# Patient Record
Sex: Female | Born: 1980 | Race: White | Hispanic: No | Marital: Married | Smoking: Former smoker
Health system: Southern US, Community
[De-identification: ages and names within clinical notes are randomized; demographics above are authoritative.]

## PROBLEM LIST (undated history)

## (undated) HISTORY — PX: TUBAL LIGATION: SHX77

---

## 2018-07-23 ENCOUNTER — Ambulatory Visit (INDEPENDENT_AMBULATORY_CARE_PROVIDER_SITE_OTHER): Payer: BC Managed Care – PPO

## 2018-07-23 ENCOUNTER — Encounter: Payer: Self-pay | Admitting: Emergency Medicine

## 2018-07-23 ENCOUNTER — Other Ambulatory Visit: Payer: Self-pay

## 2018-07-23 ENCOUNTER — Ambulatory Visit
Admission: EM | Admit: 2018-07-23 | Discharge: 2018-07-23 | Disposition: A | Discharge status: Home / Self Care | Payer: BC Managed Care – PPO | Attending: Urgent Care | Admitting: Urgent Care

## 2018-07-23 DIAGNOSIS — M25572 Pain in left ankle and joints of left foot: Secondary | ICD-10-CM

## 2018-07-23 DIAGNOSIS — W1842XA Slipping, tripping and stumbling without falling due to stepping into hole or opening, initial encounter: Secondary | ICD-10-CM | POA: Diagnosis not present

## 2018-07-23 DIAGNOSIS — S93402A Sprain of unspecified ligament of left ankle, initial encounter: Secondary | ICD-10-CM | POA: Diagnosis not present

## 2018-07-23 NOTE — Discharge Instructions (Signed)
It was very nice seeing you today in clinic. Thank you for entrusting me with your care.   Use Ibuprofen as needed for pain. REST, ICE, and ELEVATE. Wear ACE wrap for comfort.   Make arrangements to follow up with your regular doctor in 1 week for re-evaluation if not improving. If your symptoms/condition worsens, please seek follow up care either here or in the ER. Please remember, our [facility] providers are "right here with you" when you need [place].   Again, it was my pleasure to take care of you today. Thank you for choosing our clinic. I hope that you start to feel better quickly.   Honor Loh, MSN, APRN, FNP-C, CEN Advanced Practice Provider [facility]

## 2018-07-23 NOTE — ED Triage Notes (Signed)
Pt c/o left ankle pain. She states that she tripped and fell in a hole 2 days ago. Ankle is swollen and bruised.

## 2018-07-23 NOTE — ED Provider Notes (Signed)
[place], [place]   Name: Gabrielle Shepherd DOB: 07-21-1980 MRN: [id number] CSN: [id number] PCP: Gabrielle Shepherd  Arrival date and time:  07/23/18 1655  Chief Complaint:  Ankle Pain (left)   NOTE: Prior to seeing the patient today, I have reviewed the triage nursing documentation and vital signs. Clinical staff has updated patient's PMH/PSHx, current medication list, and drug allergies/intolerances to ensure comprehensive history available to assist in medical decision making.   History:   HPI: Gabrielle Shepherd is a 38 y.o. female who presents today with complaints of LEFT ankle pain x 2 days. Patient advises that she was walking in her yard and stepped in a hold causing an inversion injury. She states, "I guess the grass was a little high and I didn't see the hole". She notes pain, swelling, and bruising since the accident. Patient denies previous injuries to this ankle; no surgical interventions. Despite her symptoms, patient has not taken any over the counter interventions to help improve/relieve her reported symptoms at home.   History reviewed. No pertinent past medical history.  Past Surgical History:  Procedure Laterality Date  . TUBAL LIGATION      Family History  Problem Relation Age of Onset  . Healthy Mother   . Healthy Father     Social History   Tobacco Use  . Smoking status: Current Every Day Smoker  . Smokeless tobacco: Never Used  Substance Use Topics  . Alcohol use: Not Currently  . Drug use: Not Currently    There are no active problems to display for this patient.   Home Medications:    Current Meds  Medication Sig  . buPROPion (WELLBUTRIN SR) 150 MG 12 hr tablet Take by mouth.  Marland Kitchen. FLUoxetine (PROZAC) 40 MG capsule TAKE ONE CAPSULE ONCE DAILY.    Allergies:   Patient has no known allergies.  Review of Systems (ROS): Review of Systems  Constitutional: Negative for chills and fever.  Respiratory: Negative for cough and shortness of breath.    Cardiovascular: Negative for chest pain and palpitations.  Musculoskeletal: Positive for gait problem (2/2 pain).       Acute LEFT ankle pain s/p inversion injury.   Skin: Negative for color change and wound.  Neurological: Negative for dizziness, weakness, numbness and headaches.     Vital Signs: Today's Vitals   07/23/18 1722 07/23/18 1725 07/23/18 1810  BP:  (!) 121/92   Pulse:  71   Resp:  18   Temp:  98.6 F (37 C)   TempSrc:  Oral   SpO2:  100%   Weight: 160 lb (72.6 kg)    Height: 5\' 7"  (1.702 m)    PainSc: 2   2     Physical Exam: Physical Exam  Constitutional: She is oriented to person, place, and time and well-developed, well-nourished, and in no distress.  HENT:  Head: Normocephalic and atraumatic.  Mouth/Throat: Mucous membranes are normal.  Neck: Normal range of motion. Neck supple. No tracheal deviation present.  Cardiovascular: Normal rate, regular rhythm, normal heart sounds and intact distal pulses. Exam reveals no gallop and no friction rub.  No murmur heard. Pulmonary/Chest: Effort normal and breath sounds normal. No respiratory distress. She has no wheezes. She has no rales.  Musculoskeletal:     Left ankle: She exhibits swelling and ecchymosis. She exhibits normal range of motion, no deformity and normal pulse. Tenderness. Lateral malleolus tenderness found.  Neurological: She is alert and oriented to person, place, and time. Gait normal. GCS  score is 15.  Skin: Skin is warm and dry. No rash noted.  Psychiatric: Mood, memory, affect and judgment normal.  Nursing note and vitals reviewed.   Urgent Care Treatments / Results:   LABS: PLEASE NOTE: all labs that were ordered this encounter are listed, however only abnormal results are displayed. Labs Reviewed - No data to display  EKG: -None  RADIOLOGY: Dg Ankle Complete Left  Result Date: 07/23/2018 CLINICAL DATA:  Pain I am going to inversion injury 2 days ago EXAM: LEFT ANKLE COMPLETE - 3+ VIEW  COMPARISON:  None FINDINGS: Osseous mineralization normal. Joint spaces preserved. No acute fracture, dislocation, or bone destruction. IMPRESSION: No acute osseous abnormalities. Electronically Signed   By: Gabrielle Shepherd M.D.   On: 07/23/2018 17:43    PROCEDURES: Procedures  MEDICATIONS RECEIVED THIS VISIT: Medications - No data to display  PERTINENT CLINICAL COURSE NOTES/UPDATES:   Initial Impression / Assessment and Plan / Urgent Care Course:  Pertinent labs & imaging results that were available during my care of the patient were personally reviewed by me and considered in my medical decision making (see lab/imaging section of note for values and interpretations).  Gabrielle Shepherd is a 38 y.o. female who presents to [facility] today with complaints of Ankle Pain (left)   Patient overall well appearing and in no acute distress today in clinic. Exam reveals pain swelling to LEFT ankle following inversion injury x 2 days ago. Findings located mainly over lateral malleolus. (+) PMS; temperature, color, and capillary refill all WNL. Foot/ankle warm and dry. Diagnostic radiographs reveal no acute fracture or dislocations. Symptoms consistent with sprain. Will place in compression wrap for comfort. Discussed conservative treat using RICE modality. Offered Tramadol for severe pain, however patient refused.  Discussed follow up with primary care physician in 1 week for re-evaluation. I have reviewed the follow up and strict return precautions for any new or worsening symptoms. Patient is aware of symptoms that would be deemed urgent/emergent, and would thus require further evaluation either here or in the emergency department. At the time of discharge, she verbalized understanding and consent with the discharge plan as it was reviewed with her. All questions were fielded by provider and/or clinic staff prior to patient discharge.    Final Clinical Impressions / Urgent Care Diagnoses:   Final  diagnoses:  Sprain of left ankle, unspecified ligament, initial encounter    New Prescriptions:  [place] Controlled Substance Registry consulted? Not Applicable  No orders of the defined types were placed in this encounter.   Recommended Follow up Care:  Patient encouraged to follow up with the following provider within the specified time frame, or sooner as dictated by the severity of her symptoms. As always, she was instructed that for any urgent/emergent care needs, she should seek care either here or in the emergency department for more immediate evaluation.  Follow-up Information    Bevely Palmer, Shepherd In 1 week.   Specialty: Physician Assistant Why: General reassessment of symptoms if not improving Contact information: [place] [address] [phone]         NOTE: This note was prepared using Dragon dictation software along with smaller phrase technology. Despite my best ability to proofread, there is the potential that transcriptional errors may still occur from this process, and are completely unintentional.     Karen Kitchens, NP 07/25/18 0127

## 2018-10-09 ENCOUNTER — Emergency Department: Payer: BC Managed Care – PPO

## 2018-10-09 ENCOUNTER — Emergency Department
Admission: EM | Admit: 2018-10-09 | Discharge: 2018-10-10 | Disposition: A | Discharge status: Home / Self Care | Payer: BC Managed Care – PPO | Attending: Emergency Medicine | Admitting: Emergency Medicine

## 2018-10-09 ENCOUNTER — Other Ambulatory Visit: Payer: Self-pay

## 2018-10-09 DIAGNOSIS — F17210 Nicotine dependence, cigarettes, uncomplicated: Secondary | ICD-10-CM | POA: Diagnosis not present

## 2018-10-09 DIAGNOSIS — R079 Chest pain, unspecified: Secondary | ICD-10-CM | POA: Insufficient documentation

## 2018-10-09 DIAGNOSIS — E041 Nontoxic single thyroid nodule: Secondary | ICD-10-CM | POA: Insufficient documentation

## 2018-10-09 DIAGNOSIS — F10920 Alcohol use, unspecified with intoxication, uncomplicated: Secondary | ICD-10-CM | POA: Insufficient documentation

## 2018-10-09 DIAGNOSIS — W19XXXA Unspecified fall, initial encounter: Secondary | ICD-10-CM | POA: Diagnosis not present

## 2018-10-09 MED ORDER — SODIUM CHLORIDE 0.9 % IV BOLUS
1000.0000 mL | Freq: Once | INTRAVENOUS | Status: AC
  Administered 2018-10-10: 1000 mL via INTRAVENOUS

## 2018-10-09 NOTE — ED Provider Notes (Signed)
[facility] Emergency Department Provider Note   ____________________________________________   First MD Initiated Contact with Patient 10/09/18 2334     (approximate)  I have reviewed the triage vital signs and the nursing notes.   HISTORY  Chief Complaint Fall    HPI Gabrielle Shepherd is a 38 y.o. female brought to the ED via EMS from home with a chief complaint of fall.  Patient states she now remembers everything: went for a walk before since that, slipped and fell into a creek.  Did not strike head or suffer LOC.  She remembers walking home and think she either fell or laid down in her yard. Found by her neighbor and husband. Unknown how long she was there but husband thinks not long. Patient endorses 3 beers, no substances.  Denies recent fever, cough, chest pain, shortness of breath, abdominal pain, nausea, vomiting or dysuria.  Denies anticoagulant use.  Patient is not a diabetic.  EMS reports glucose 64; given D10.       Past Medical History None  There are no active problems to display for this patient.   Past Surgical History:  Procedure Laterality Date   TUBAL LIGATION      Prior to Admission medications   Medication Sig Start Date End Date Taking? Authorizing Provider  buPROPion (WELLBUTRIN SR) 150 MG 12 hr tablet Take by mouth.    [provider]  FLUoxetine (PROZAC) 40 MG capsule TAKE ONE CAPSULE ONCE DAILY. 11/19/17   [provider]    Allergies Patient has no known allergies.  Family History  Problem Relation Age of Onset   Healthy Mother    Healthy Father     Social History Social History   Tobacco Use   Smoking status: Current Every Day Smoker   Smokeless tobacco: Never Used  Substance Use Topics   Alcohol use: Not Currently   Drug use: Not Currently    Review of Systems  Constitutional: Positive for fall.  No fever/chills Eyes: No visual changes. ENT: No sore throat. Cardiovascular:  Denies chest pain. Respiratory: Denies shortness of breath. Gastrointestinal: No abdominal pain.  No nausea, no vomiting.  No diarrhea.  No constipation. Genitourinary: Negative for dysuria. Musculoskeletal: Negative for back pain. Skin: Negative for rash. Neurological: Negative for headaches, focal weakness or numbness.   ____________________________________________   PHYSICAL EXAM:  VITAL SIGNS: ED Triage Vitals  Enc Vitals Group     BP 10/09/18 2147 (!) 138/100     Pulse Rate 10/09/18 2147 77     Resp 10/09/18 2147 18     Temp 10/09/18 2147 98.4 F (36.9 C)     Temp Source 10/09/18 2147 Oral     SpO2 10/09/18 2147 97 %     Weight 10/09/18 2139 145 lb (65.8 kg)     Height 10/09/18 2139 5\' 7"  (1.702 m)     Head Circumference --      Peak Flow --      Pain Score 10/09/18 2139 0     Pain Loc --      Pain Edu? --      Excl. in GC? --     Constitutional: Alert and oriented. Well appearing and in no acute distress. Eyes: Conjunctivae are normal. PERRL. EOMI. Head: Atraumatic. Nose: Atraumatic. Mouth/Throat: Mucous membranes are moist.  No dental malocclusion. Neck: No stridor.  No cervical spine tenderness to palpation. Cardiovascular: Normal rate, regular rhythm. Grossly normal heart sounds.  Good peripheral circulation. Respiratory: Normal respiratory  effort.  No retractions. Lungs CTAB. Gastrointestinal: Soft and nontender to light or deep palpation. No distention. No abdominal bruits. No CVA tenderness. Musculoskeletal: No lower extremity tenderness nor edema.  No joint effusions. Neurologic: Alert and oriented x3.  CN II-XII grossly intact.  Normal speech and language. No gross focal neurologic deficits are appreciated.  Skin:  Skin is warm, dry and intact. No rash noted. Psychiatric: Mood and affect are normal. Speech and behavior are normal.  ____________________________________________   LABS (all labs ordered are listed, but only abnormal results are  displayed)  Labs Reviewed  CBC WITH DIFFERENTIAL/PLATELET - Abnormal; Notable for the following components:      Result Value   RBC 3.85 (*)    All other components within normal limits  COMPREHENSIVE METABOLIC PANEL - Abnormal; Notable for the following components:   Chloride 113 (*)    CO2 21 (*)    Glucose, Bld 105 (*)    All other components within normal limits  ETHANOL - Abnormal; Notable for the following components:   Alcohol, Ethyl (B) 93 (*)    All other components within normal limits  URINALYSIS, COMPLETE (UACMP) WITH MICROSCOPIC - Abnormal; Notable for the following components:   Color, Urine YELLOW (*)    APPearance HAZY (*)    All other components within normal limits  LIPASE, BLOOD  URINE DRUG SCREEN, QUALITATIVE ([facility] ONLY)  CK  TSH  T4, FREE  POC URINE PREG, ED  POCT PREGNANCY, URINE  TROPONIN I (HIGH SENSITIVITY)  TROPONIN I (HIGH SENSITIVITY)   ____________________________________________  EKG  ED ECG REPORT I, Satin Boal J, the attending physician, personally viewed and interpreted this ECG.   Date: 10/09/2018  EKG Time: 0014  Rate: 68  Rhythm: normal EKG, normal sinus rhythm  Axis: Normal  Intervals:none  ST&T Change: Nonspecific  ED ECG REPORT I, Adlynn Lowenstein J, the attending physician, personally viewed and interpreted this ECG.   Date: 10/10/2018  EKG Time: 0258  Rate: 73  Rhythm: normal EKG, normal sinus rhythm  Axis: Normal  Intervals:none  ST&T Change: Nonspecific  ____________________________________________  RADIOLOGY  ED MD interpretation: No ICH; no acute cardiopulmonary process; no PE  Official radiology report(s): Ct Head Wo Contrast  Result Date: 10/10/2018 CLINICAL DATA:  Patient reports she went for a walk and fell into a creek. Get out of Creek and was found by family splaying in the yard. EXAM: CT HEAD WITHOUT CONTRAST TECHNIQUE: Contiguous axial images were obtained from the base of the skull through the vertex  without intravenous contrast. COMPARISON:  None. FINDINGS: Brain: No evidence of acute infarction, hemorrhage, hydrocephalus, extra-axial collection or mass lesion/mass effect. Prominent perivascular space versus remote lacunar infarct in the right thalamus. Vascular: No hyperdense vessel or unexpected calcification. Skull: No fracture or focal lesion. Sinuses/Orbits: Paranasal sinuses and mastoid air cells are clear. The visualized orbits are unremarkable. Other: None. IMPRESSION: No acute intracranial abnormality. Electronically Signed   By: Narda RutherfordMelanie  Sanford M.D.   On: 10/10/2018 00:10   Ct Angio Chest Pe W/cm &/or Wo Cm  Result Date: 10/10/2018 CLINICAL DATA:  38 year old female with substernal chest pain. EXAM: CT ANGIOGRAPHY CHEST WITH CONTRAST TECHNIQUE: Multidetector CT imaging of the chest was performed using the standard protocol during bolus administration of intravenous contrast. Multiplanar CT image reconstructions and MIPs were obtained to evaluate the vascular anatomy. CONTRAST:  75mL OMNIPAQUE IOHEXOL 350 MG/ML SOLN COMPARISON:  Chest radiograph dated 10/10/2018 FINDINGS: Cardiovascular: There is no cardiomegaly or pericardial effusion. The thoracic aorta is  unremarkable. The visualized origins of the great vessels of the aortic arch appear patent. There is no CT evidence of pulmonary embolism. Mediastinum/Nodes: No hilar or mediastinal adenopathy. The esophagus is grossly unremarkable. Partially calcified 12 mm left thyroid nodule. Further evaluation with ultrasound on a nonemergent basis recommended. No mediastinal fluid collection. Lungs/Pleura: The lungs are clear. There is no pleural effusion or pneumothorax. The central airways are patent. Upper Abdomen: No acute abnormality. Musculoskeletal: No chest wall abnormality. No acute or significant osseous findings. Review of the MIP images confirms the above findings. IMPRESSION: No acute intrathoracic pathology. No CT evidence of pulmonary  embolism. Electronically Signed   By: Anner Crete M.D.   On: 10/10/2018 03:45   Dg Chest Port 1 View  Result Date: 10/10/2018 CLINICAL DATA:  38 year old female with chest pain status post fall. Evaluate for aspiration. EXAM: PORTABLE CHEST 1 VIEW COMPARISON:  None. FINDINGS: The heart size and mediastinal contours are within normal limits. Both lungs are clear. The visualized skeletal structures are unremarkable. IMPRESSION: No active disease. Electronically Signed   By: Anner Crete M.D.   On: 10/10/2018 00:28    ____________________________________________   PROCEDURES  Procedure(s) performed (including Critical Care):  Procedures   ____________________________________________   INITIAL IMPRESSION / ASSESSMENT AND PLAN / ED COURSE  As part of my medical decision making, I reviewed the following data within the [place] History obtained from family, Nursing notes reviewed and incorporated, Labs reviewed, EKG interpreted, Radiograph reviewed, Notes from prior ED visits and [place] Controlled Substance Database     Velisa Regnier was evaluated in Emergency Department on 10/10/2018 for the symptoms described in the history of present illness. She was evaluated in the context of the global COVID-19 pandemic, which necessitated consideration that the patient might be at risk for infection with the SARS-CoV-2 virus that causes COVID-19. Institutional protocols and algorithms that pertain to the evaluation of patients at risk for COVID-19 are in a state of rapid change based on information released by regulatory bodies including the CDC and federal and state organizations. These policies and algorithms were followed during the patient's care in the ED.    38 year old female who presents to the ED status post fall, walked at home and was found laying in her yard.  Differential diagnosis includes but is not limited to [place], CVA, ACS, infectious, metabolic, toxicological  etiologies, etc.  Patient back to baseline currently.  Will obtain lab work, urine, CT head and reassess.  Clinical Course as of Oct 09 624  Thu Oct 10, 2018  0224 Updated patient and spouse on all test results.  Patient feels fine and is eager for discharge home.  Strict return precautions given.  Both verbalized understanding and agree with plan of care.   [JS]  0177 While getting ready for discharge, patient stood up and had sudden onset of sharp, substernal chest pain.  States she has acid reflux.  Will repeat EKG, obtain troponin and send for CTA chest.   [JS]  0349 Patient feeling better.  Updated patient and spouse on CT result.  Strict return precautions given.  Both verbalized understanding agree with plan of care.   [JS]    Clinical Course User Index [JS] Paulette Blanch, MD     ____________________________________________   FINAL CLINICAL IMPRESSION(S) / ED DIAGNOSES  Final diagnoses:  Fall, initial encounter  Chest pain, unspecified type  Thyroid nodule     ED Discharge Orders    None  Note:  This document was prepared using Dragon voice recognition software and may include unintentional dictation errors.   Irean Hong, MD 10/10/18 [phone]

## 2018-10-09 NOTE — ED Triage Notes (Signed)
Patient reports she went out for a walk and fell into a creek.  Remembers the fall and remembers getting out creek, on way home she either fell or laid down on ground in yard and was found by family and they were concerned.

## 2018-10-09 NOTE — ED Notes (Signed)
Pt to ED lobby via w/c, mask in place with no distress noted by EMS; st called out for a "fall"; hx PTSD, anxiety; st pt fell in a creek; D10 admin for FSBS 64; st has had "3 beers"

## 2018-10-10 ENCOUNTER — Emergency Department: Payer: BC Managed Care – PPO

## 2018-10-10 LAB — URINE DRUG SCREEN, QUALITATIVE (ARMC ONLY)
Amphetamines, Ur Screen: NOT DETECTED
Barbiturates, Ur Screen: NOT DETECTED
Benzodiazepine, Ur Scrn: NOT DETECTED
Cannabinoid 50 Ng, Ur ~~LOC~~: NOT DETECTED
Cocaine Metabolite,Ur ~~LOC~~: NOT DETECTED
MDMA (Ecstasy)Ur Screen: NOT DETECTED
Methadone Scn, Ur: NOT DETECTED
Opiate, Ur Screen: NOT DETECTED
Phencyclidine (PCP) Ur S: NOT DETECTED
Tricyclic, Ur Screen: NOT DETECTED

## 2018-10-10 LAB — CBC WITH DIFFERENTIAL/PLATELET
Abs Immature Granulocytes: 0.02 10*3/uL (ref 0.00–0.07)
Basophils Absolute: 0.1 10*3/uL (ref 0.0–0.1)
Basophils Relative: 1 %
Eosinophils Absolute: 0.1 10*3/uL (ref 0.0–0.5)
Eosinophils Relative: 1 %
HCT: 37.7 % (ref 36.0–46.0)
Hemoglobin: 12.5 g/dL (ref 12.0–15.0)
Immature Granulocytes: 0 %
Lymphocytes Relative: 23 %
Lymphs Abs: 2 10*3/uL (ref 0.7–4.0)
MCH: 32.5 pg (ref 26.0–34.0)
MCHC: 33.2 g/dL (ref 30.0–36.0)
MCV: 97.9 fL (ref 80.0–100.0)
Monocytes Absolute: 0.3 10*3/uL (ref 0.1–1.0)
Monocytes Relative: 3 %
Neutro Abs: 6.4 10*3/uL (ref 1.7–7.7)
Neutrophils Relative %: 72 %
Platelets: 234 10*3/uL (ref 150–400)
RBC: 3.85 MIL/uL — ABNORMAL LOW (ref 3.87–5.11)
RDW: 12.4 % (ref 11.5–15.5)
WBC: 8.8 10*3/uL (ref 4.0–10.5)
nRBC: 0 % (ref 0.0–0.2)

## 2018-10-10 LAB — COMPREHENSIVE METABOLIC PANEL
ALT: 21 U/L (ref 0–44)
AST: 24 U/L (ref 15–41)
Albumin: 4.1 g/dL (ref 3.5–5.0)
Alkaline Phosphatase: 60 U/L (ref 38–126)
Anion gap: 9 (ref 5–15)
BUN: 10 mg/dL (ref 6–20)
CO2: 21 mmol/L — ABNORMAL LOW (ref 22–32)
Calcium: 8.9 mg/dL (ref 8.9–10.3)
Chloride: 113 mmol/L — ABNORMAL HIGH (ref 98–111)
Creatinine, Ser: 0.7 mg/dL (ref 0.44–1.00)
GFR calc Af Amer: >60 mL/min (ref >60)
GFR calc non Af Amer: >60 mL/min (ref >60)
Glucose, Bld: 105 mg/dL — ABNORMAL HIGH (ref 70–99)
Potassium: 4.1 mmol/L (ref 3.5–5.1)
Sodium: 143 mmol/L (ref 135–145)
Total Bilirubin: 0.4 mg/dL (ref 0.3–1.2)
Total Protein: 6.7 g/dL (ref 6.5–8.1)

## 2018-10-10 LAB — URINALYSIS, COMPLETE (UACMP) WITH MICROSCOPIC
Bacteria, UA: NONE SEEN
Bilirubin Urine: NEGATIVE
Glucose, UA: NEGATIVE mg/dL
Hgb urine dipstick: NEGATIVE
Ketones, ur: NEGATIVE mg/dL
Leukocytes,Ua: NEGATIVE
Nitrite: NEGATIVE
Protein, ur: NEGATIVE mg/dL
Specific Gravity, Urine: 1.016 (ref 1.005–1.030)
pH: 5 (ref 5.0–8.0)

## 2018-10-10 LAB — ETHANOL: Alcohol, Ethyl (B): 93 mg/dL — ABNORMAL HIGH (ref <10)

## 2018-10-10 LAB — T4, FREE: Free T4: 0.75 ng/dL (ref 0.61–1.12)

## 2018-10-10 LAB — POCT PREGNANCY, URINE: Preg Test, Ur: NEGATIVE

## 2018-10-10 LAB — TSH: TSH: 1.73 u[IU]/mL (ref 0.350–4.500)

## 2018-10-10 LAB — LIPASE, BLOOD: Lipase: 35 U/L (ref 11–51)

## 2018-10-10 LAB — TROPONIN I (HIGH SENSITIVITY): Troponin I (High Sensitivity): <2 ng/L (ref <18)

## 2018-10-10 LAB — CK: Total CK: 70 U/L (ref 38–234)

## 2018-10-10 MED ORDER — ALUM & MAG HYDROXIDE-SIMETH 200-200-20 MG/5ML PO SUSP
30.0000 mL | Freq: Once | ORAL | Status: AC
  Administered 2018-10-10: 30 mL via ORAL
  Filled 2018-10-10: qty 30

## 2018-10-10 MED ORDER — FAMOTIDINE IN NACL 20-0.9 MG/50ML-% IV SOLN
20.0000 mg | Freq: Once | INTRAVENOUS | Status: DC
  Filled 2018-10-10: qty 50

## 2018-10-10 MED ORDER — IOHEXOL 350 MG/ML SOLN
75.0000 mL | Freq: Once | INTRAVENOUS | Status: AC | PRN
  Administered 2018-10-10: 75 mL via INTRAVENOUS

## 2018-10-10 MED ORDER — LIDOCAINE VISCOUS HCL 2 % MT SOLN
15.0000 mL | Freq: Once | OROMUCOSAL | Status: AC
  Administered 2018-10-10: 04:00:00 15 mL via ORAL
  Filled 2018-10-10: qty 15

## 2018-10-10 NOTE — ED Notes (Signed)
Report off to andrea rn  

## 2018-10-10 NOTE — ED Notes (Signed)
Pt to CT

## 2018-10-10 NOTE — ED Notes (Signed)
Pt unable to void at this time. 

## 2018-10-10 NOTE — Discharge Instructions (Addendum)
Please follow-up with your doctor for further work-up of your thyroid nodule.  Return to the ER for worsening symptoms, persistent vomiting, difficulty breathing or other concerns.

## 2018-10-10 NOTE — ED Notes (Signed)
poct pregnancy Negative 

## 2018-10-10 NOTE — ED Notes (Signed)
Pt resting quietly  Family with pt   Iv fluids infusing.

## 2019-04-05 ENCOUNTER — Ambulatory Visit: Payer: BC Managed Care – PPO | Attending: Internal Medicine

## 2019-04-05 DIAGNOSIS — Z23 Encounter for immunization: Secondary | ICD-10-CM

## 2019-04-05 NOTE — Progress Notes (Signed)
   Covid-19 Vaccination Clinic  Name:  Gabrielle Shepherd    MRN: [id number] DOB: September 27, 1980  04/05/2019  [name] was observed post Covid-19 immunization for 15 minutes without incident. She was provided with Vaccine Information Sheet and instruction to access the V-Safe system.   [name] was instructed to call 911 with any severe reactions post vaccine: Marland Kitchen Difficulty breathing  . Swelling of face and throat  . A fast heartbeat  . A bad rash all over body  . Dizziness and weakness   Immunizations Administered    Name Date Dose VIS Date Route   Pfizer COVID-19 Vaccine 04/05/2019 11:28 AM 0.3 mL 12/27/2018 Intramuscular   Manufacturer: [employer], [employer]   Lot: [id number]   NDC: [address]-0

## 2019-04-30 ENCOUNTER — Ambulatory Visit: Payer: BC Managed Care – PPO | Attending: Internal Medicine

## 2019-04-30 DIAGNOSIS — Z23 Encounter for immunization: Secondary | ICD-10-CM

## 2019-04-30 NOTE — Progress Notes (Signed)
   Covid-19 Vaccination Clinic  Name:  Gabrielle Shepherd    MRN: [id number] DOB: [birth year]  04/30/2019  [name] was observed post Covid-19 immunization for 15 minutes without incident. She was provided with Vaccine Information Sheet and instruction to access the V-Safe system.   [name] was instructed to call 911 with any severe reactions post vaccine: Marland Kitchen Difficulty breathing  . Swelling of face and throat  . A fast heartbeat  . A bad rash all over body  . Dizziness and weakness   Immunizations Administered    Name Date Dose VIS Date Route   Pfizer COVID-19 Vaccine 04/30/2019 10:29 AM 0.3 mL 12/27/2018 Intramuscular   Manufacturer: [employer], [employer]   Lot: [id number]   NDC: [address]-3

## 2019-11-10 ENCOUNTER — Ambulatory Visit
Admission: EM | Admit: 2019-11-10 | Discharge: 2019-11-10 | Disposition: A | Discharge status: Home / Self Care | Payer: BC Managed Care – PPO | Attending: Emergency Medicine | Admitting: Emergency Medicine

## 2019-11-10 ENCOUNTER — Other Ambulatory Visit: Payer: Self-pay

## 2019-11-10 ENCOUNTER — Encounter: Payer: Self-pay | Admitting: Emergency Medicine

## 2019-11-10 ENCOUNTER — Ambulatory Visit (INDEPENDENT_AMBULATORY_CARE_PROVIDER_SITE_OTHER)
Admit: 2019-11-10 | Discharge: 2019-11-10 | Disposition: A | Discharge status: Home / Self Care | Payer: BC Managed Care – PPO | Attending: Emergency Medicine | Admitting: Emergency Medicine

## 2019-11-10 DIAGNOSIS — S0990XA Unspecified injury of head, initial encounter: Secondary | ICD-10-CM

## 2019-11-10 DIAGNOSIS — S060X9A Concussion with loss of consciousness of unspecified duration, initial encounter: Secondary | ICD-10-CM | POA: Diagnosis present

## 2019-11-10 LAB — COMPREHENSIVE METABOLIC PANEL
ALT: 23 U/L (ref 0–44)
AST: 24 U/L (ref 15–41)
Albumin: 4.4 g/dL (ref 3.5–5.0)
Alkaline Phosphatase: 57 U/L (ref 38–126)
Anion gap: 10 (ref 5–15)
BUN: 11 mg/dL (ref 6–20)
CO2: 24 mmol/L (ref 22–32)
Calcium: 8.8 mg/dL — ABNORMAL LOW (ref 8.9–10.3)
Chloride: 104 mmol/L (ref 98–111)
Creatinine, Ser: 0.79 mg/dL (ref 0.44–1.00)
GFR, Estimated: >60 mL/min (ref >60)
Glucose, Bld: 132 mg/dL — ABNORMAL HIGH (ref 70–99)
Potassium: 3.8 mmol/L (ref 3.5–5.1)
Sodium: 138 mmol/L (ref 135–145)
Total Bilirubin: 0.8 mg/dL (ref 0.3–1.2)
Total Protein: 7.1 g/dL (ref 6.5–8.1)

## 2019-11-10 LAB — CBC WITH DIFFERENTIAL/PLATELET
Abs Immature Granulocytes: 0.03 10*3/uL (ref 0.00–0.07)
Basophils Absolute: 0.1 10*3/uL (ref 0.0–0.1)
Basophils Relative: 1 %
Eosinophils Absolute: 0.1 10*3/uL (ref 0.0–0.5)
Eosinophils Relative: 1 %
HCT: 40.6 % (ref 36.0–46.0)
Hemoglobin: 13.7 g/dL (ref 12.0–15.0)
Immature Granulocytes: 0 %
Lymphocytes Relative: 34 %
Lymphs Abs: 2.6 10*3/uL (ref 0.7–4.0)
MCH: 31.2 pg (ref 26.0–34.0)
MCHC: 33.7 g/dL (ref 30.0–36.0)
MCV: 92.5 fL (ref 80.0–100.0)
Monocytes Absolute: 0.4 10*3/uL (ref 0.1–1.0)
Monocytes Relative: 6 %
Neutro Abs: 4.4 10*3/uL (ref 1.7–7.7)
Neutrophils Relative %: 58 %
Platelets: 226 10*3/uL (ref 150–400)
RBC: 4.39 MIL/uL (ref 3.87–5.11)
RDW: 12.4 % (ref 11.5–15.5)
WBC: 7.6 10*3/uL (ref 4.0–10.5)
nRBC: 0 % (ref 0.0–0.2)

## 2019-11-10 MED ORDER — ONDANSETRON 8 MG PO TBDP: 8.0000 mg | ORAL_TABLET | Freq: Three times a day (TID) | ORAL | 0 refills | Status: DC | PRN

## 2019-11-10 NOTE — ED Triage Notes (Signed)
Patient states she fell Saturday when she slipped and fell on some walnuts. She states she hit her head on the ground and had no LOC. She states Sunday early am she got up to go to the bathroom and passed out and hit her head on the floor. She states she was out for a few seconds. She states she got up to continue to go to the bathroom and passed out again, hitting her head on the bathroom counter. She had LOC again but she is not sure for how long. Her husband was home and he came to her and she was still unconscious. EMS was called and they advised for her to go to the ER but she refused. She is now c/o headache, nausea, and fatigue.

## 2019-11-10 NOTE — Discharge Instructions (Addendum)
Your CT scan was negative for a bleed or skull fracture.  Your symptoms are consistent with a concussion.  You need brain rest and to avoid screen time, including phones, tablets, computers, or television.  Your blood work and EKG were also unremarkable.  We still do not have a reason for your 2 episodes of passing out.  I am recommending that you follow-up with your primary care doctor this week for possible referral to cardiology for Holter monitor study to rule out an arrhythmia.  Use the Zofran as needed for nausea.  If you have another episode of passing out you need to go to the ER for evaluation.

## 2019-11-10 NOTE — ED Provider Notes (Signed)
[facility]    CSN: [id number] Arrival date & time: 11/10/19  1532     Patient is here for evaluation of headache, nausea, and fatigue.  Patient reports that she slipped on some walnuts and fell Saturday hitting her head on the ground but did not lose consciousness.  Sunday morning she got up to go to the bathroom and passed out hitting her head on the floor.  She is not sure how long she was out but thinks it was for a few seconds.  She reports she got up to use the bathroom again and passed out hitting her head on the bathroom counter.  She had another loss of consciousness but she is not sure for how long.  She reports she was found unresponsive by her husband.  EMS was called and advised her to go to the ER but she refused.  Patient denies visual changes, numbness, tingling, weakness, changes in appetite.  Patient denies chest pain, or shortness of breath, or palpitations.  Patient has had syncope in the past.  One time was for some stroke and the other time was because of low blood sugar.  History   Chief Complaint Chief Complaint  Patient presents with  . Head Injury  . Loss of Consciousness  . Fall    HPI Gabrielle Shepherd is a 39 y.o. female.   HPI  History reviewed. No pertinent past medical history.  There are no problems to display for this patient.   Past Surgical History:  Procedure Laterality Date  . TUBAL LIGATION      OB History   No obstetric history on file.      Home Medications    Prior to Admission medications   Medication Sig Start Date End Date Taking? Authorizing Provider  buPROPion (WELLBUTRIN SR) 150 MG 12 hr tablet Take by mouth.  11/10/19  [provider]  FLUoxetine (PROZAC) 40 MG capsule TAKE ONE CAPSULE ONCE DAILY. 11/19/17 11/10/19  [provider]    Family History Family History  Problem Relation Age of Onset  . Healthy Mother   . Healthy Father     Social History Social History   Tobacco Use  .  Smoking status: Former Games developer  . Smokeless tobacco: Never Used  Vaping Use  . Vaping Use: Every day  Substance Use Topics  . Alcohol use: Yes  . Drug use: Not Currently     Allergies   Patient has no known allergies.   Review of Systems Review of Systems  Constitutional: Positive for fatigue. Negative for activity change, appetite change and fever.  HENT: Negative for congestion, nosebleeds, sinus pain and sore throat.   Respiratory: Negative for cough, shortness of breath and wheezing.   Cardiovascular: Negative for chest pain and palpitations.  Gastrointestinal: Positive for nausea. Negative for diarrhea and vomiting.  Genitourinary: Negative for dysuria and frequency.  Musculoskeletal: Positive for back pain and neck pain. Negative for arthralgias and myalgias.       Patient has pain in her neck and back.  Patient also has pain in both parietal areas of her skull.  Skin: Negative for color change and rash.  Neurological: Positive for syncope and headaches. Negative for dizziness, tremors, seizures, speech difficulty, weakness and numbness.  Psychiatric/Behavioral: Negative.      Physical Exam Triage Vital Signs ED Triage Vitals  Enc Vitals Group     BP 11/10/19 1637 110/78     Pulse Rate 11/10/19 1637 65     Resp  11/10/19 1637 18     Temp 11/10/19 1637 98.5 F (36.9 C)     Temp Source 11/10/19 1637 Oral     SpO2 11/10/19 1637 100 %     Weight 11/10/19 1634 170 lb (77.1 kg)     Height 11/10/19 1634 5\' 7"  (1.702 m)     Head Circumference --      Peak Flow --      Pain Score 11/10/19 1634 3     Pain Loc --      Pain Edu? --      Excl. in GC? --    No data found.  Updated Vital Signs BP 110/78 (BP Location: Right Arm)   Pulse 65   Temp 98.5 F (36.9 C) (Oral)   Resp 18   Ht 5\' 7"  (1.702 m)   Wt 170 lb (77.1 kg)   SpO2 100%   BMI 26.63 kg/m   Visual Acuity Right Eye Distance:   Left Eye Distance:   Bilateral Distance:    Right Eye Near:   Left Eye  Near:    Bilateral Near:     Physical Exam Vitals and nursing note reviewed.  Constitutional:      General: She is not in acute distress.    Appearance: Normal appearance. She is not ill-appearing.  HENT:     Head: Normocephalic and atraumatic.     Comments: Patient complaining of pain in both left and right parietal area.  No hematoma, abrasion, or swelling noted on exam.  Both areas are tender to touch.    Right Ear: Tympanic membrane, ear canal and external ear normal. There is no impacted cerumen.     Left Ear: Tympanic membrane, ear canal and external ear normal. There is no impacted cerumen.     Nose: Nose normal. No rhinorrhea.     Mouth/Throat:     Mouth: Mucous membranes are moist.     Pharynx: Oropharynx is clear. No oropharyngeal exudate or posterior oropharyngeal erythema.  Eyes:     General: No scleral icterus.       Right eye: No discharge.        Left eye: No discharge.     Extraocular Movements: Extraocular movements intact.     Conjunctiva/sclera: Conjunctivae normal.     Pupils: Pupils are equal, round, and reactive to light.  Neck:     Vascular: No carotid bruit.     Comments: Patient has no bony tenderness in her neck.  Patient does have paraspinal tenderness on both sides of her neck. Cardiovascular:     Rate and Rhythm: Normal rate and regular rhythm.     Pulses: Normal pulses.     Heart sounds: Normal heart sounds. No murmur heard.  No gallop.   Pulmonary:     Effort: Pulmonary effort is normal.     Breath sounds: Normal breath sounds. No wheezing, rhonchi or rales.  Musculoskeletal:        General: Tenderness present. No swelling, deformity or signs of injury. Normal range of motion.     Cervical back: Normal range of motion and neck supple.     Right lower leg: No edema.     Left lower leg: No edema.     Comments: Patient complaining of pain over the both mastoid processes.  No battle sign or raccoon eyes noted.  Lymphadenopathy:     Cervical: No  cervical adenopathy.  Skin:    General: Skin is warm and dry.  Capillary Refill: Capillary refill takes less than 2 seconds.     Findings: No bruising or erythema.  Neurological:     General: No focal deficit present.     Mental Status: She is alert and oriented to person, place, and time.     Cranial Nerves: No cranial nerve deficit.     Sensory: No sensory deficit.     Motor: No weakness.     Coordination: Coordination normal.     Gait: Gait normal.     Deep Tendon Reflexes: Reflexes normal.     Comments: Groups, upper arm, lower leg strength 5 out of 5.  DTRs 2+ globally.  Psychiatric:        Mood and Affect: Mood normal.        Behavior: Behavior normal.        Thought Content: Thought content normal.        Judgment: Judgment normal.      UC Treatments / Results  Labs (all labs ordered are listed, but only abnormal results are displayed) Labs Reviewed  COMPREHENSIVE METABOLIC PANEL - Abnormal; Notable for the following components:      Result Value   Glucose, Bld 132 (*)    Calcium 8.8 (*)    All other components within normal limits  CBC WITH DIFFERENTIAL/PLATELET    EKG   Radiology CT Head Wo Contrast  Result Date: 11/10/2019 CLINICAL DATA:  Larey Seat and hit head EXAM: CT HEAD WITHOUT CONTRAST TECHNIQUE: Contiguous axial images were obtained from the base of the skull through the vertex without intravenous contrast. COMPARISON:  CT brain 10/09/2018 FINDINGS: Brain: No acute territorial infarction, hemorrhage, or intracranial mass. The ventricles are nonenlarged. Dilated perivascular space or chronic infarct in the right basal ganglia as before. Nonenlarged ventricles Vascular: No hyperdense vessels.  No unexpected calcification Skull: Normal. Negative for fracture or focal lesion. Sinuses/Orbits: No acute finding. Other: None IMPRESSION: No CT evidence for acute intracranial abnormality. Electronically Signed   By: Jasmine Pang M.D.   On: 11/10/2019 18:11     Procedures Procedures (including critical care time)  Medications Ordered in UC Medications - No data to display  Initial Impression / Assessment and Plan / UC Course  I have reviewed the triage vital signs and the nursing notes.  Pertinent labs & imaging results that were available during my care of the patient were reviewed by me and considered in my medical decision making (see chart for details).   Patient is here for 2 separate episodes of syncope with loss of consciousness of unknown duration.  These have been 2 days ago.  Patient was evaluated by EMS at the time and advised to go to the ER but she declined.  She is here because she is still having a headache, nausea, and fatigue.  Physical exam reveals no ecchymosis, edema, or hematoma to scalp or head.  No spinal tenderness to palpation.  No carotid bruits, heart sounds S1-S2, lung sounds clear to auscultation bilaterally patient has full range of motion of all extremities without deformity, ecchymosis, or abrasion.  Strength is strong and equal bilaterally deep tendon reflexes are normal globally.  Cranial nerves II through XII intact.  Discussed need for ER evaluation with the patient.  Patient has a PCP.  Discussed the concerns about syncope without attributable cause.  We will do screening labs, EKG, and head CT.   CBC unremarkable  CMP shows elevated glucose of 132 electrolytes LFTs normal.  EKG shows normal sinus rhythm with a  rate of 63.  Normal axis.  No interval change when compared to EKG from 10/15/2018.  CT negative for acute intracranial process.  Will DC home with a diagnosis of concussion and have patient follow-up with PCP this week.   Final Clinical Impressions(s) / UC Diagnoses   Final diagnoses:  Injury of head, initial encounter  Concussion with loss of consciousness, initial encounter     Discharge Instructions     Your CT scan was negative for a bleed or skull fracture.  Your symptoms are consistent  with a concussion.  You need brain rest and to avoid screen time, including phones, tablets, computers, or television.  Your blood work and EKG were also unremarkable.  We still do not have a reason for your 2 episodes of passing out.  I am recommending that you follow-up with your primary care doctor this week for possible referral to cardiology for Holter monitor study to rule out an arrhythmia.  If you have another episode of passing out you need to go to the ER for evaluation.    ED Prescriptions    None     PDMP not reviewed this encounter.   Becky Augusta, NP 11/10/19 1829

## 2019-11-11 NOTE — ED Notes (Signed)
Called [employer] [phone]) to get prior auth of CT Head without contrast. Spoke to [place], auth# [id number] valid 11/10/19.

## 2020-06-30 ENCOUNTER — Encounter: Payer: Self-pay | Admitting: Emergency Medicine

## 2020-06-30 ENCOUNTER — Ambulatory Visit
Admission: EM | Admit: 2020-06-30 | Discharge: 2020-06-30 | Disposition: A | Discharge status: Home / Self Care | Payer: BC Managed Care – PPO | Attending: Physician Assistant | Admitting: Physician Assistant

## 2020-06-30 ENCOUNTER — Other Ambulatory Visit: Payer: Self-pay

## 2020-06-30 DIAGNOSIS — M2550 Pain in unspecified joint: Secondary | ICD-10-CM

## 2020-06-30 DIAGNOSIS — S30861A Insect bite (nonvenomous) of abdominal wall, initial encounter: Secondary | ICD-10-CM

## 2020-06-30 DIAGNOSIS — W57XXXA Bitten or stung by nonvenomous insect and other nonvenomous arthropods, initial encounter: Secondary | ICD-10-CM

## 2020-06-30 DIAGNOSIS — R5383 Other fatigue: Secondary | ICD-10-CM | POA: Diagnosis not present

## 2020-06-30 DIAGNOSIS — R110 Nausea: Secondary | ICD-10-CM | POA: Diagnosis not present

## 2020-06-30 MED ORDER — TRIAMCINOLONE ACETONIDE 0.5 % EX OINT: 1.0000 "application " | TOPICAL_OINTMENT | Freq: Two times a day (BID) | CUTANEOUS | 0 refills | Status: AC

## 2020-06-30 MED ORDER — DOXYCYCLINE HYCLATE 100 MG PO CAPS: 100.0000 mg | ORAL_CAPSULE | Freq: Two times a day (BID) | ORAL | 0 refills | Status: AC

## 2020-06-30 NOTE — Discharge Instructions (Addendum)
I am unsure if your symptoms are related to tick bite or not, but it is possible and you have decided to be treated for that possibility today. I have sent in doxycycline to pharmacy. You should continue to treat headaches and joint pains with OTC Tylenol an Advil, rest and increase fluids. If still having symptoms after completing medication or if symptoms worsen, please return or see PCP.

## 2020-06-30 NOTE — ED Provider Notes (Signed)
[facility]    CSN: [id number] Arrival date & time: 06/30/20  0901      History   Chief Complaint Chief Complaint  Patient presents with   Insect Bite   Fatigue    HPI Gabrielle Shepherd is a 40 y.o. female presenting for concerns about possible tick bite.  Patient states that about a week ago she noticed a red and itchy area on her abdomen.  She believes that there was a possible tick there.  She will see 1.  States that she often is bit by ticks.  Patient concerned about tick disease.  She states that over the past 3 days she started to feel fatigued and have joint pains as well as headache and nausea.  Patient has not had any fevers.  She denies any cough, congestion, sore throat, dizziness, breathing difficulty, vomiting or diarrhea.  She has been taking Advil for her joint pains and headaches.  She denies sick contacts and no known exposure to COVID. Patient is only concerned about possible tick disease she has no other complaints.  HPI  History reviewed. No pertinent past medical history.  There are no problems to display for this patient.   Past Surgical History:  Procedure Laterality Date   TUBAL LIGATION      OB History   No obstetric history on file.      Home Medications    Prior to Admission medications   Medication Sig Start Date End Date Taking? Authorizing Provider  doxycycline (VIBRAMYCIN) 100 MG capsule Take 1 capsule (100 mg total) by mouth 2 (two) times daily for 10 days. 06/30/20 07/10/20 Yes Eusebio Friendly B, PA-C  triamcinolone ointment (KENALOG) 0.5 % Apply 1 application topically 2 (two) times daily for 7 days. 06/30/20 07/07/20 Yes Eusebio Friendly B, PA-C  ondansetron (ZOFRAN ODT) 8 MG disintegrating tablet Take 1 tablet (8 mg total) by mouth every 8 (eight) hours as needed for nausea or vomiting. 11/10/19   Becky Augusta, NP  buPROPion [facility] SR) 150 MG 12 hr tablet Take by mouth.  11/10/19  [provider]  FLUoxetine (PROZAC) 40  MG capsule TAKE ONE CAPSULE ONCE DAILY. 11/19/17 11/10/19  [provider]    Family History Family History  Problem Relation Age of Onset   Healthy Mother    Healthy Father     Social History Social History   Tobacco Use   Smoking status: Former    Pack years: 0.00   Smokeless tobacco: Never  Vaping Use   Vaping Use: Every day  Substance Use Topics   Alcohol use: Yes   Drug use: Not Currently     Allergies   Patient has no known allergies.   Review of Systems Review of Systems  Constitutional:  Positive for fatigue. Negative for fever.  HENT:  Negative for congestion, rhinorrhea and sore throat.   Respiratory:  Negative for cough, shortness of breath and wheezing.   Gastrointestinal:  Positive for nausea. Negative for abdominal pain, diarrhea and vomiting.  Musculoskeletal:  Positive for arthralgias. Negative for joint swelling.  Skin:  Positive for rash.  Neurological:  Positive for headaches. Negative for dizziness and weakness.    Physical Exam Triage Vital Signs ED Triage Vitals [06/30/20 0953]  Enc Vitals Group     BP 121/82     Pulse Rate 62     Resp 18     Temp 98 F (36.7 C)     Temp Source Oral     SpO2  100 %     Weight 169 lb 15.6 oz (77.1 kg)     Height 5\' 7"  (1.702 m)     Head Circumference      Peak Flow      Pain Score 2     Pain Loc      Pain Edu?      Excl. in GC?    No data found.  Updated Vital Signs BP 121/82 (BP Location: Right Arm)   Pulse 62   Temp 98 F (36.7 C) (Oral)   Resp 18   Ht 5\' 7"  (1.702 m)   Wt 169 lb 15.6 oz (77.1 kg)   SpO2 100%   BMI 26.62 kg/m       Physical Exam Vitals and nursing note reviewed.  Constitutional:      General: She is not in acute distress.    Appearance: Normal appearance. She is not ill-appearing or toxic-appearing.  HENT:     Head: Normocephalic and atraumatic.     Nose: Nose normal.     Mouth/Throat:     Mouth: Mucous membranes are moist.     Pharynx: Oropharynx is  clear.  Eyes:     General: No scleral icterus.       Right eye: No discharge.        Left eye: No discharge.     Extraocular Movements: Extraocular movements intact.     Conjunctiva/sclera: Conjunctivae normal.     Pupils: Pupils are equal, round, and reactive to light.  Cardiovascular:     Rate and Rhythm: Normal rate and regular rhythm.     Heart sounds: Normal heart sounds.  Pulmonary:     Effort: Pulmonary effort is normal. No respiratory distress.     Breath sounds: Normal breath sounds.  Abdominal:     Palpations: Abdomen is soft.     Tenderness: There is no abdominal tenderness.  Musculoskeletal:     Cervical back: Neck supple.  Skin:    General: Skin is dry.     Findings: Rash (quarter sized area of erythma left of umbilicus. Non tender) present.  Neurological:     General: No focal deficit present.     Mental Status: She is alert. Mental status is at baseline.     Motor: No weakness.     Gait: Gait normal.  Psychiatric:        Mood and Affect: Mood normal.        Behavior: Behavior normal.        Thought Content: Thought content normal.     UC Treatments / Results  Labs (all labs ordered are listed, but only abnormal results are displayed) Labs Reviewed - No data to display  EKG   Radiology No results found.  Procedures Procedures (including critical care time)  Medications Ordered in UC Medications - No data to display  Initial Impression / Assessment and Plan / UC Course  I have reviewed the triage vital signs and the nursing notes.  Pertinent labs & imaging results that were available during my care of the patient were reviewed by me and considered in my medical decision making (see chart for details).  40 year old female presenting for possible tick bite or rash of the abdominal wall, fatigue, polyarthralgias, nausea and headaches.  Patient concerned about tick disease.  Vitals are all stable.  Exam significant for a erythematous rash to the left  of the umbilicus that is nontender.  Unsure if this is an actual tick bite since  she did not see one, but patient concerned about tick disease.  Given some of her symptoms, feel it is reasonable to treat her for tick disease with doxycycline 10-day course.  Also sent in triamcinolone ointment.  Advised abortive care with the Advil and increase rest and fluids.  Advised close follow-up with PCP, especially if symptoms are worsening or not improving.  Advised to return here for any new or worsening symptoms if she cannot see her PCP.  ED precautions reviewed.  Final Clinical Impressions(s) / UC Diagnoses   Final diagnoses:  Fatigue, unspecified type  Polyarthralgia  Nausea without vomiting  Tick bite of abdominal wall, initial encounter     Discharge Instructions      I am unsure if your symptoms are related to tick bite or not, but it is possible and you have decided to be treated for that possibility today. I have sent in doxycycline to pharmacy. You should continue to treat headaches and joint pains with OTC Tylenol an Advil, rest and increase fluids. If still having symptoms after completing medication or if symptoms worsen, please return or see PCP.      ED Prescriptions     Medication Sig Dispense Auth. Provider   triamcinolone ointment (KENALOG) 0.5 % Apply 1 application topically 2 (two) times daily for 7 days. 30 g Eusebio Friendly B, PA-C   doxycycline (VIBRAMYCIN) 100 MG capsule Take 1 capsule (100 mg total) by mouth 2 (two) times daily for 10 days. 20 capsule Shirlee Latch, PA-C      PDMP not reviewed this encounter.   Shirlee Latch, PA-C 06/30/20 [phone]

## 2020-06-30 NOTE — ED Triage Notes (Signed)
Pt c/o tick bite on the center of her abdomen. Noticed the area itching about 3 days ago. Started having fatigue, joint pain, headache and nausea about 2 days ago. Denies fever.

## 2021-08-08 ENCOUNTER — Other Ambulatory Visit: Payer: Self-pay | Admitting: Family Medicine

## 2021-08-08 DIAGNOSIS — Z1231 Encounter for screening mammogram for malignant neoplasm of breast: Secondary | ICD-10-CM

## 2022-03-05 IMAGING — CT CT HEAD W/O CM
1 series · 16 of 30 positions shown, 20 images · non-contrast
Comparison: CT brain 10/09/2018

CLINICAL DATA: Fell and hit head

EXAM:
CT HEAD WITHOUT CONTRAST
TECHNIQUE: Contiguous axial images were obtained from the base of the skull
through the vertex without intravenous contrast.

[Series 2: head wo · axial · 0.45mm/px · z∈[+189,+324]mm · 16 of 30 slices shown, 20 images]
[im 2/30  brain]
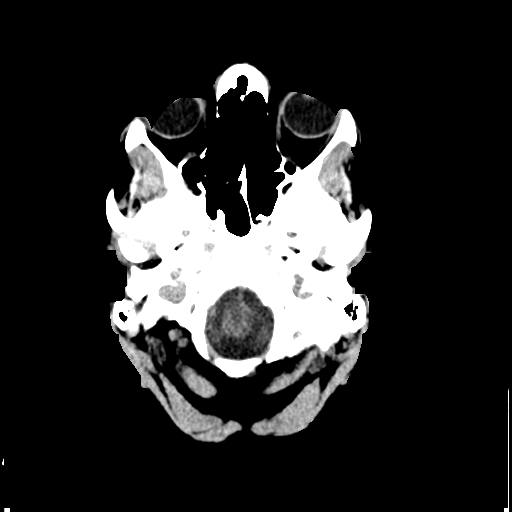
[im 2/30  bone]
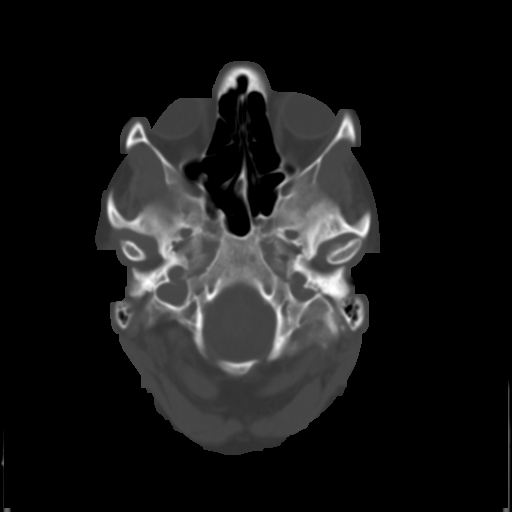
[im 4/30  brain]
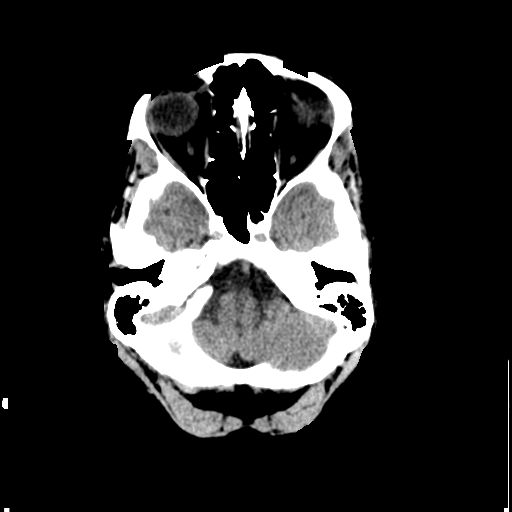
[im 6/30  brain]
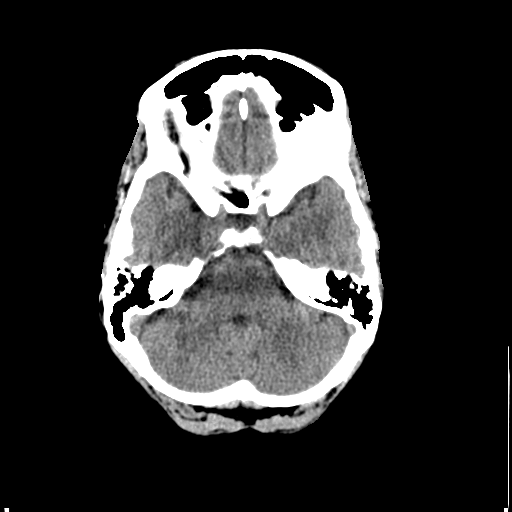
[im 8/30  brain]
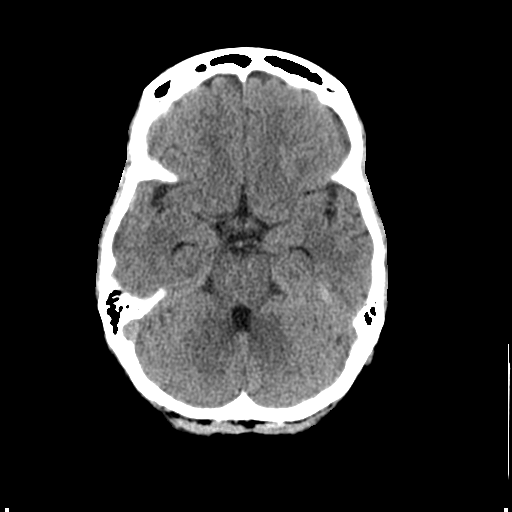
[im 9/30  brain]
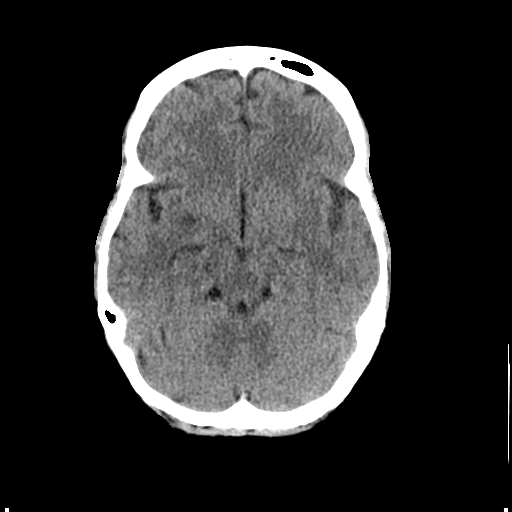
[im 9/30  bone]
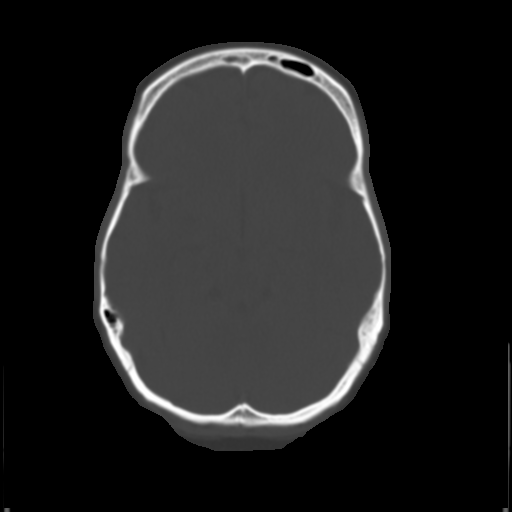
[im 11/30  brain]
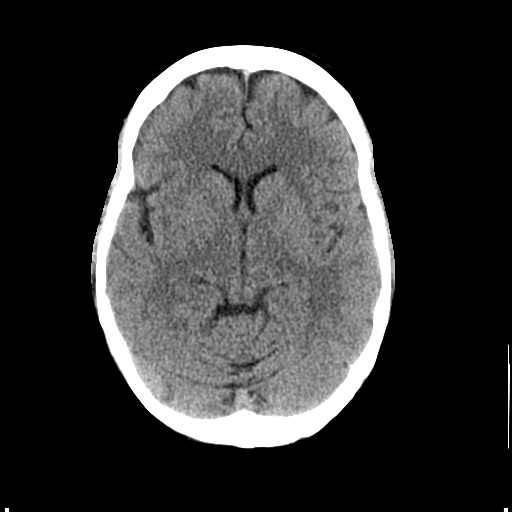
[im 13/30  brain]
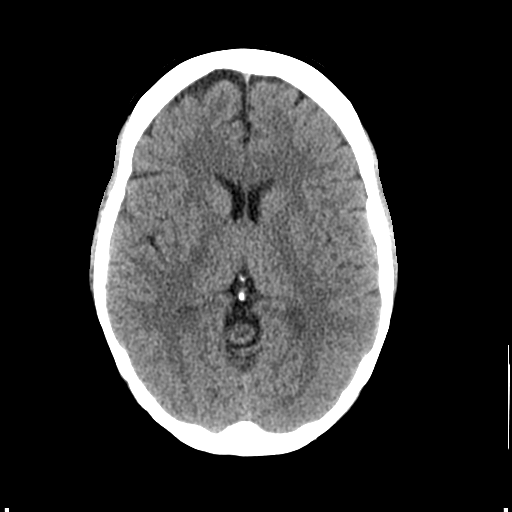
[im 15/30  brain]
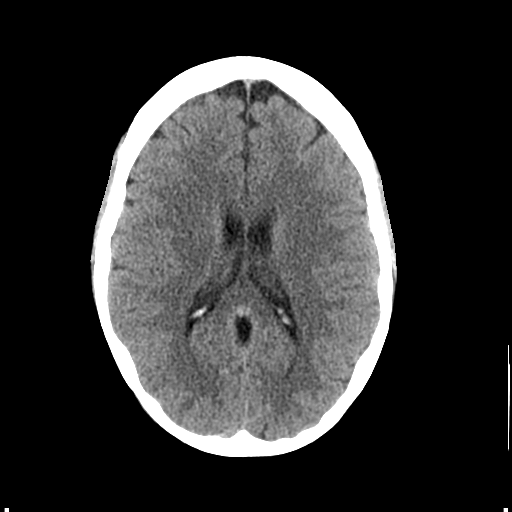
[im 16/30  brain]
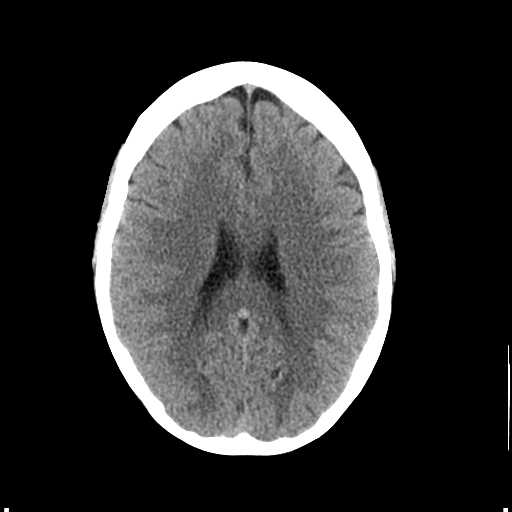
[im 16/30  bone]
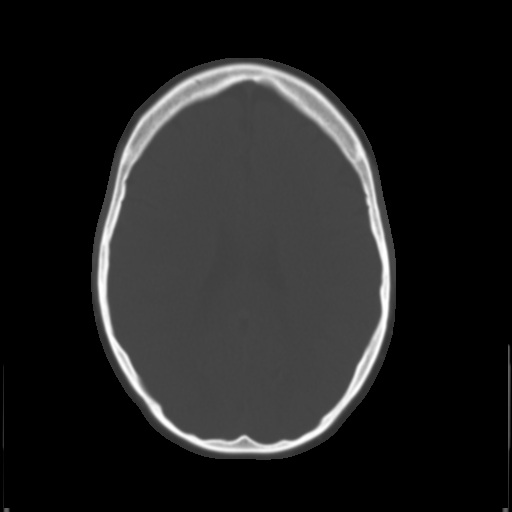
[im 18/30  brain]
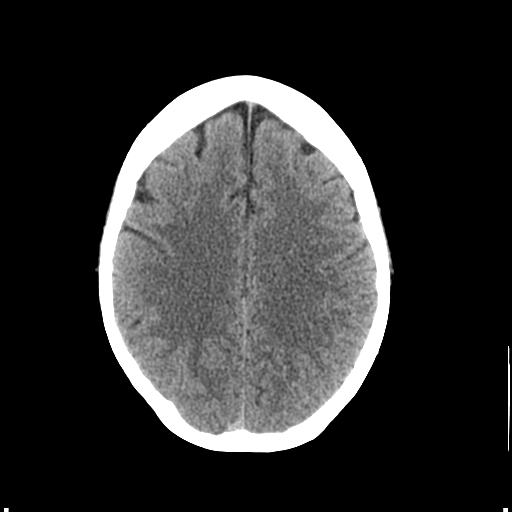
[im 20/30  brain]
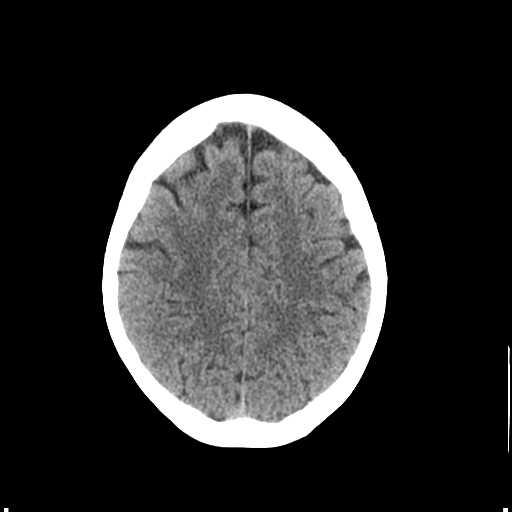
[im 22/30  brain]
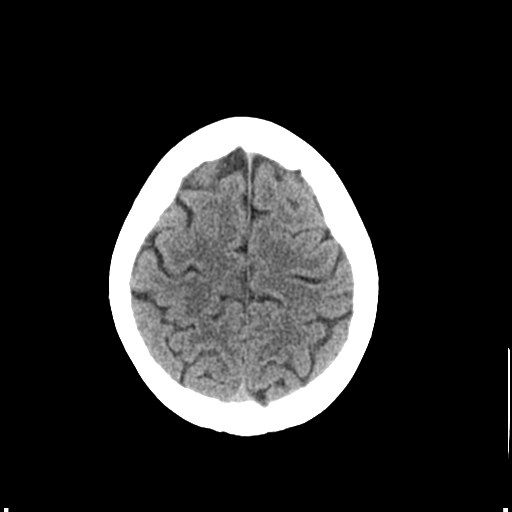
[im 23/30  brain]
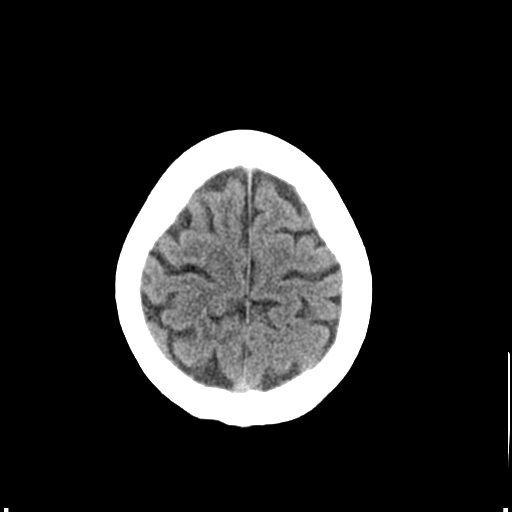
[im 23/30  bone]
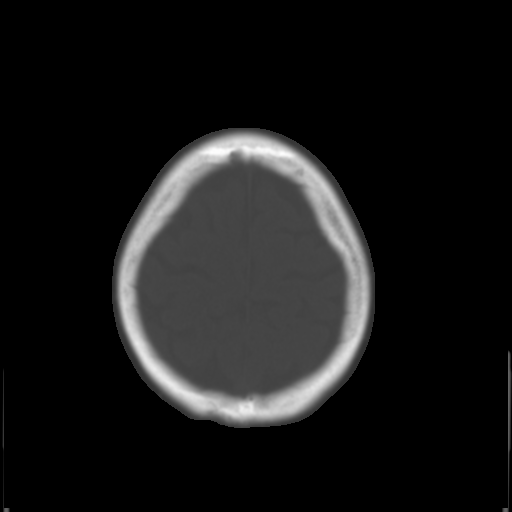
[im 25/30  brain]
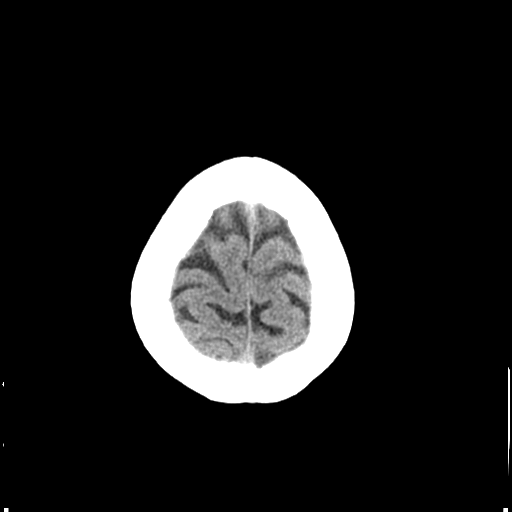
[im 27/30  brain]
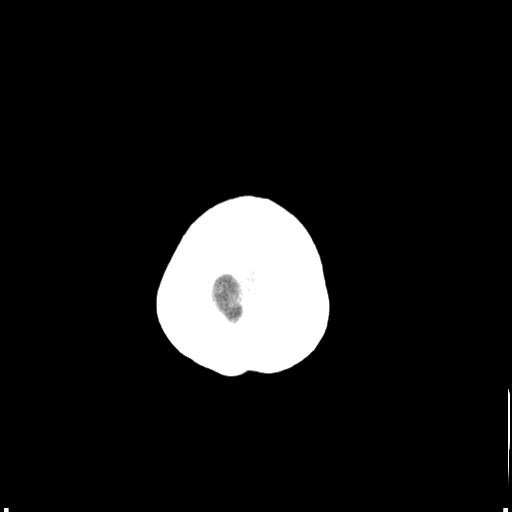
[im 29/30  brain]
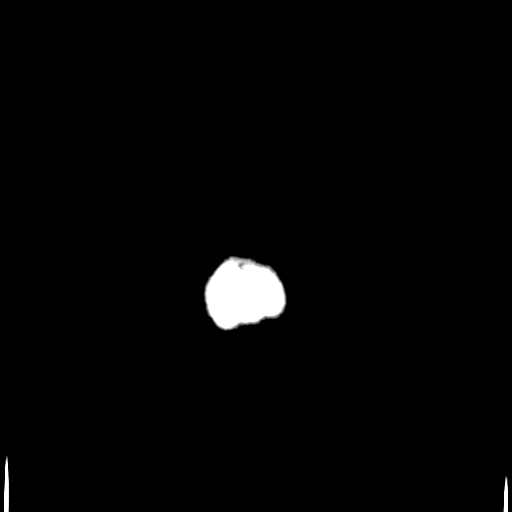

[16 of 30 positions shown; findings below may reference images not displayed]

FINDINGS: Brain: No acute territorial infarction, hemorrhage, or intracranial
mass. The ventricles are nonenlarged. Dilated perivascular space or
chronic infarct in the right basal ganglia as before. Nonenlarged
ventricles

Vascular: No hyperdense vessels.  No unexpected calcification

Skull: Normal. Negative for fracture or focal lesion.

Sinuses/Orbits: No acute finding.

Other: None
IMPRESSION: No CT evidence for acute intracranial abnormality.

## 2023-04-25 ENCOUNTER — Encounter: Payer: Self-pay | Admitting: *Deleted

## 2023-04-25 ENCOUNTER — Ambulatory Visit
Admission: EM | Admit: 2023-04-25 | Discharge: 2023-04-25 | Disposition: A | Discharge status: Home / Self Care | Attending: Family Medicine | Admitting: Family Medicine

## 2023-04-25 ENCOUNTER — Encounter: Payer: Self-pay | Admitting: Family Medicine

## 2023-04-25 ENCOUNTER — Ambulatory Visit
Admission: RE | Admit: 2023-04-25 | Discharge: 2023-04-25 | Disposition: A | Discharge status: Home / Self Care | Source: Ambulatory Visit | Attending: Family Medicine | Admitting: Family Medicine

## 2023-04-25 DIAGNOSIS — M79604 Pain in right leg: Secondary | ICD-10-CM | POA: Diagnosis not present

## 2023-04-25 DIAGNOSIS — M25561 Pain in right knee: Secondary | ICD-10-CM | POA: Insufficient documentation

## 2023-04-25 NOTE — ED Triage Notes (Signed)
 Patient states she's concerned she may have blood clot, feels band like tightness behind right knee, tightness to right calf.  No personal history of recent injury, surgery or blood clot.

## 2023-04-25 NOTE — ED Provider Notes (Addendum)
 [facility]    CSN: [id number] Arrival date & time: 04/25/23  1606      History   Chief Complaint Chief Complaint  Patient presents with   Leg Pain    HPI  HPI Gabrielle Shepherd is a 43 y.o. female.   Gabrielle Shepherd presents for right leg pain for the past 1.5 weeks. Feels like a band behind her knee.  Squeezing her calf makes the band tighter. Feels like her leg is going to sleep. Has tingling "low grade pain."  No recent travel, estrogen use, cancer, pregnant.  No personal history of PE or DVT.  Multiple family members have had DVTs.  Taken Advil for pain which helped some. Denies chest pain and shortness of breath.     History reviewed. No pertinent past medical history.  There are no active problems to display for this patient.   Past Surgical History:  Procedure Laterality Date   TUBAL LIGATION      OB History   No obstetric history on file.      Home Medications    Prior to Admission medications   Medication Sig Start Date End Date Taking? Authorizing Provider  buPROPion (WELLBUTRIN SR) 150 MG 12 hr tablet Take by mouth.  11/10/19  [provider]  FLUoxetine (PROZAC) 40 MG capsule TAKE ONE CAPSULE ONCE DAILY. 11/19/17 11/10/19  [provider]    Family History Family History  Problem Relation Age of Onset   Healthy Mother    Healthy Father     Social History Social History   Tobacco Use   Smoking status: Former   Smokeless tobacco: Never  Vaping Use   Vaping status: Every Day  Substance Use Topics   Alcohol use: Yes    Alcohol/week: 3.0 standard drinks of alcohol    Types: 3 Glasses of wine per week    Comment: 2-3 glasses of wine daily   Drug use: Not Currently     Allergies   Nickel   Review of Systems Review of Systems: :negative unless otherwise stated in HPI.      Physical Exam Triage Vital Signs ED Triage Vitals  Encounter Vitals Group     BP 04/25/23 1631 137/87     Systolic BP Percentile --       Diastolic BP Percentile --      Pulse Rate 04/25/23 1631 94     Resp 04/25/23 1631 18     Temp 04/25/23 1631 98 F (36.7 C)     Temp Source 04/25/23 1631 Oral     SpO2 04/25/23 1631 96 %     Weight 04/25/23 1629 180 lb (81.6 kg)     Height 04/25/23 1629 5\' 7"  (1.702 m)     Head Circumference --      Peak Flow --      Pain Score 04/25/23 1628 2     Pain Loc --      Pain Education --      Exclude from Growth Chart --    No data found.  Updated Vital Signs BP 137/87 (BP Location: Left Arm)   Pulse 94   Temp 98 F (36.7 C) (Oral)   Resp 18   Ht 5\' 7"  (1.702 m)   Wt 81.6 kg   SpO2 96%   BMI 28.19 kg/m   Visual Acuity Right Eye Distance:   Left Eye Distance:   Bilateral Distance:    Right Eye Near:   Left Eye Near:    Bilateral  Near:     Physical Exam GEN: well appearing female in no acute distress  CVS: well perfused, RRR  RESP: speaking in full sentences without pause, no respiratory distress, Clear  MSK:  Right lower leg:  No evidence of bony deformity, asymmetry, or muscle atrophy. No bony tenderness over knee but has posterior popliteal fossa TTP +calf tenderness  Full active and passive ROM Strength 5/5  Sensation intact. Peripheral pulses intact.   UC Treatments / Results  Labs (all labs ordered are listed, but only abnormal results are displayed) Labs Reviewed - No data to display  EKG   Radiology US  Venous Img Lower Unilateral Right (DVT) Result Date: 04/25/2023 CLINICAL DATA:  Knee pain EXAM: RIGHT LOWER EXTREMITY VENOUS DOPPLER ULTRASOUND TECHNIQUE: Gray-scale sonography with compression, as well as color and duplex ultrasound, were performed to evaluate the deep venous system(s) from the level of the common femoral vein through the popliteal and proximal calf veins. COMPARISON:  None Available. FINDINGS: VENOUS Normal compressibility of the common femoral, superficial femoral, and popliteal veins, as well as the visualized calf veins. Visualized  portions of profunda femoral vein and great saphenous vein unremarkable. No filling defects to suggest DVT on grayscale or color Doppler imaging. Doppler waveforms show normal direction of venous flow, normal respiratory plasticity and response to augmentation. Limited views of the contralateral common femoral vein are unremarkable. OTHER None. Limitations: none IMPRESSION: No evidence RIGHT lower extremity deep venous thrombosis. Electronically Signed   By: Deboraha Fallow M.D.   On: 04/25/2023 20:22      Procedures Procedures (including critical care time)  Medications Ordered in UC Medications - No data to display  Initial Impression / Assessment and Plan / UC Course  I have reviewed the triage vital signs and the nursing notes.  Pertinent labs & imaging results that were available during my care of the patient were reviewed by me and considered in my medical decision making (see chart for details).      Pt is a 43 y.o.  female with 1-2 weeks of right calf pain with significant family history of DVTs.   DDX: DVT, gastrocnemius  or soleus strain, tendonitis, Bakers cyst, mass  On exam, pt has tenderness at popliteal fossa and calf tenderness concerning for possible DVT.   Discussed obtaining STAT DVT Ultrasound and pt agrees. Pt to travel to [facility] ED for imaging. I will call her with results.    Patient to gradually return to normal activities, as tolerated and continue ordinary activities within the limits permitted by pain. Advil and/or Tylenol PRN. Reviewed red flag symptoms. Return and ED precautions given. Understanding voiced. Discussed MDM, treatment plan and plan for follow-up with patient who agrees with plan.   Final Clinical Impressions(s) / UC Diagnoses   Final diagnoses:  Right leg pain     Discharge Instructions      Go to the medical mall to have an ultrasound performed. Look for signs at [facility] for the [facility]. Enter through the  medical mall and go to the radiology department for your ultrasound.       ED Prescriptions   None    PDMP not reviewed this encounter.      Emberleigh Reily, DO 05/02/23 1110

## 2023-04-25 NOTE — Discharge Instructions (Signed)
Go to the medical mall to have an ultrasound performed. Look for signs at [facility] for the [facility]. Enter through the medical mall and go to the radiology department for your ultrasound.
